# Patient Record
Sex: Male | Born: 1980 | Race: Black or African American | Hispanic: No | Smoking: Current every day smoker
Health system: Southern US, Community
[De-identification: ages and names within clinical notes are randomized; demographics above are authoritative.]

## PROBLEM LIST (undated history)

## (undated) DIAGNOSIS — I10 Essential (primary) hypertension: Secondary | ICD-10-CM

## (undated) HISTORY — PX: TONSILLECTOMY: SUR1361

---

## 2015-10-24 ENCOUNTER — Emergency Department: Payer: Medicaid Other

## 2015-10-24 ENCOUNTER — Emergency Department
Admission: EM | Admit: 2015-10-24 | Discharge: 2015-10-24 | Disposition: A | Discharge status: Home / Self Care | Payer: Medicaid Other | Attending: Emergency Medicine | Admitting: Emergency Medicine

## 2015-10-24 ENCOUNTER — Encounter: Payer: Self-pay | Admitting: Emergency Medicine

## 2015-10-24 DIAGNOSIS — S0990XA Unspecified injury of head, initial encounter: Secondary | ICD-10-CM | POA: Insufficient documentation

## 2015-10-24 DIAGNOSIS — W2111XA Struck by baseball bat, initial encounter: Secondary | ICD-10-CM | POA: Insufficient documentation

## 2015-10-24 DIAGNOSIS — R51 Headache: Secondary | ICD-10-CM

## 2015-10-24 DIAGNOSIS — F17200 Nicotine dependence, unspecified, uncomplicated: Secondary | ICD-10-CM | POA: Diagnosis not present

## 2015-10-24 DIAGNOSIS — Y998 Other external cause status: Secondary | ICD-10-CM | POA: Insufficient documentation

## 2015-10-24 DIAGNOSIS — Y9389 Activity, other specified: Secondary | ICD-10-CM | POA: Insufficient documentation

## 2015-10-24 DIAGNOSIS — Y9289 Other specified places as the place of occurrence of the external cause: Secondary | ICD-10-CM | POA: Insufficient documentation

## 2015-10-24 DIAGNOSIS — I10 Essential (primary) hypertension: Secondary | ICD-10-CM | POA: Diagnosis not present

## 2015-10-24 DIAGNOSIS — R519 Headache, unspecified: Secondary | ICD-10-CM

## 2015-10-24 HISTORY — DX: Essential (primary) hypertension: I10
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
## 2015-10-24 NOTE — Discharge Instructions (Signed)
You were seen in the Emergency Department (ED) today for a head injury.  Your CT scan was done, it did not show any evidence of serious injury or bleeding.  At the present time, I do not see evidence of clear injury.  Symptoms to expect include nausea, mild to moderate headache, difficulty concentrating or sleeping, and mild lightheadedness.  These symptoms should improve over the next few days to weeks, but it may take many weeks before you feel back to normal.  Return to the emergency department or follow-up with your primary care doctor if your symptoms are not improving over this time.  Signs of a more serious head injury include vomiting, severe headache, excessive sleepiness or confusion, and weakness or numbness in your face, arms or legs.  Return immediately to the Emergency Department if you experience any of these more concerning symptoms.    Rest, avoid strenuous physical or mental activity, and avoid activities that could potentially result in another head injury until all your symptoms from this head injury are completely resolved for at least 2-3 weeks.  If you participate in sports, get cleared by your doctor or trainer before returning to play.  You may take ibuprofen or acetaminophen over the counter according to label instructions for mild headache or scalp soreness.
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
## 2021-04-21 DIAGNOSIS — K029 Dental caries, unspecified: Secondary | ICD-10-CM

## 2021-04-21 NOTE — ED Notes (Signed)
Discharge instructions reviewed with pt. Discharge instructions given to pt per MD. Pt able to return/verbalize discharge instructions. Copy of discharge instructions given.  Pt condition stable, respiratory status within normal limits, neuro status intact. Pt ambulatory out of ER, accompanied by girlfriend.

## 2021-04-21 NOTE — ED Notes (Signed)
 Patient presents ambulatory to triage with girlfriend. Pt is autistic and has learning disabilities per girlfriend. Pt complains of mouth pain x 1 week. Pt complains of dental pain. Poor dentition, missing teeth, and obvious decay.

## 2021-04-21 NOTE — ED Provider Notes (Signed)
40 year old male with bipolar disorder presents with complaints of 3 years mouth pain/teeth pain.  Patient reports he came in today because the pain was increasing.  Denies fever, chills, nausea, vomiting, chest pain, shortness of breath.  Denies any tooth trauma.  Denies any gum swelling/drainage.    Regular tobacco use, occasional alcohol use           Past Medical History:   Diagnosis Date   ??? Asthma    ??? Autistic behavior    ??? Psychiatric disorder     bipolar, schizophrenia       Past Surgical History:   Procedure Laterality Date   ??? HX HEENT      tonsils         History reviewed. No pertinent family history.    Social History     Socioeconomic History   ??? Marital status: Not on file     Spouse name: Not on file   ??? Number of children: Not on file   ??? Years of education: Not on file   ??? Highest education level: Not on file   Occupational History   ??? Not on file   Tobacco Use   ??? Smoking status: Current Every Day Smoker     Packs/day: 1.00   ??? Smokeless tobacco: Not on file   Substance and Sexual Activity   ??? Alcohol use: Yes     Comment: "sometimes"   ??? Drug use: Not on file   ??? Sexual activity: Not on file   Other Topics Concern   ??? Not on file   Social History Narrative   ??? Not on file     Social Determinants of Health     Financial Resource Strain:    ??? Difficulty of Paying Living Expenses: Not on file   Food Insecurity:    ??? Worried About Running Out of Food in the Last Year: Not on file   ??? Ran Out of Food in the Last Year: Not on file   Transportation Needs:    ??? Lack of Transportation (Medical): Not on file   ??? Lack of Transportation (Non-Medical): Not on file   Physical Activity:    ??? Days of Exercise per Week: Not on file   ??? Minutes of Exercise per Session: Not on file   Stress:    ??? Feeling of Stress : Not on file   Social Connections:    ??? Frequency of Communication with Friends and Family: Not on file   ??? Frequency of Social Gatherings with Friends and Family: Not on file   ??? Attends Religious  Services: Not on file   ??? Active Member of Clubs or Organizations: Not on file   ??? Attends Banker Meetings: Not on file   ??? Marital Status: Not on file   Intimate Partner Violence:    ??? Fear of Current or Ex-Partner: Not on file   ??? Emotionally Abused: Not on file   ??? Physically Abused: Not on file   ??? Sexually Abused: Not on file   Housing Stability:    ??? Unable to Pay for Housing in the Last Year: Not on file   ??? Number of Places Lived in the Last Year: Not on file   ??? Unstable Housing in the Last Year: Not on file         ALLERGIES: Patient has no known allergies.    Review of Systems   Constitutional: Negative for chills and fever.  HENT: Positive for dental problem.    Respiratory: Negative for cough and shortness of breath.    Cardiovascular: Negative for chest pain.   Gastrointestinal: Negative for abdominal pain, nausea and vomiting.   Genitourinary: Negative for dysuria and urgency.   Skin: Negative for wound.   Neurological: Negative for seizures and headaches.       Vitals:    04/21/21 2203   BP: 129/77   Pulse: 79   Resp: 16   Temp: 98.3 ??F (36.8 ??C)   SpO2: 98%   Height: 5\' 3"  (1.6 m)            Physical Exam  Constitutional:       Appearance: He is well-developed.   HENT:      Head: Normocephalic and atraumatic.      Mouth/Throat:      Dentition: Abnormal dentition. Dental caries present. No dental tenderness, gingival swelling, dental abscesses or gum lesions.      Comments: Poor dentition, multiple missing teeth, no gum swelling/induration or tenderness to palpation.  Cardiovascular:      Rate and Rhythm: Normal rate.   Pulmonary:      Effort: Pulmonary effort is normal. No respiratory distress.   Musculoskeletal:         General: Normal range of motion.      Cervical back: Normal range of motion.   Skin:     General: Skin is warm and dry.   Neurological:      Mental Status: He is alert and oriented to person, place, and time.          MDM  Number of Diagnoses or Management  Options  Dental caries  Diagnosis management comments: Chronic dental problem.  Provided with pain control and information for local dentist for follow-up.  No signs of infection.         Procedures

## 2021-04-22 ENCOUNTER — Inpatient Hospital Stay
Admit: 2021-04-22 | Discharge: 2021-04-22 | Disposition: A | Discharge status: Home / Self Care | Payer: Medicare (Managed Care) | Attending: Emergency Medicine

## 2021-04-22 MED ORDER — LIDOCAINE 2 % MUCOSAL SOLN
2 % | Freq: Once | Status: AC
Start: 2021-04-22 — End: 2021-04-21
  Administered 2021-04-22: 03:00:00

## 2021-04-22 MED ORDER — HYDROCODONE-ACETAMINOPHEN 5 MG-325 MG TAB
5-325 mg | Freq: Once | ORAL | Status: AC
Start: 2021-04-22 — End: 2021-04-21
  Administered 2021-04-22: 03:00:00 via ORAL

## 2021-04-22 MED FILL — HYDROCODONE-ACETAMINOPHEN 5 MG-325 MG TAB: 5-325 mg | ORAL | Qty: 1

## 2021-04-22 MED FILL — LIDOCAINE 2 % MUCOSAL SOLN: 2 % | Qty: 15
